# Patient Record
Sex: Female | Born: 1955 | Race: White | Hispanic: No | Marital: Married | Smoking: Never smoker
Health system: Southern US, Community
[De-identification: ages and names within clinical notes are randomized; demographics above are authoritative.]

## PROBLEM LIST (undated history)

## (undated) DIAGNOSIS — G4730 Sleep apnea, unspecified: Secondary | ICD-10-CM

## (undated) DIAGNOSIS — R519 Headache, unspecified: Secondary | ICD-10-CM

## (undated) DIAGNOSIS — J45909 Unspecified asthma, uncomplicated: Secondary | ICD-10-CM

## (undated) DIAGNOSIS — E669 Obesity, unspecified: Secondary | ICD-10-CM

## (undated) DIAGNOSIS — D649 Anemia, unspecified: Secondary | ICD-10-CM

## (undated) DIAGNOSIS — E119 Type 2 diabetes mellitus without complications: Secondary | ICD-10-CM

## (undated) DIAGNOSIS — I10 Essential (primary) hypertension: Secondary | ICD-10-CM

## (undated) DIAGNOSIS — E785 Hyperlipidemia, unspecified: Secondary | ICD-10-CM

## (undated) DIAGNOSIS — H3500 Unspecified background retinopathy: Secondary | ICD-10-CM

## (undated) DIAGNOSIS — M1990 Unspecified osteoarthritis, unspecified site: Secondary | ICD-10-CM

## (undated) HISTORY — PX: ESOPHAGOGASTRODUODENOSCOPY: SHX1529

## (undated) HISTORY — PX: CHOLECYSTECTOMY: SHX55

## (undated) HISTORY — PX: TONSILLECTOMY: SUR1361

## (undated) HISTORY — PX: COLONOSCOPY: SHX174

---

## 2005-06-16 ENCOUNTER — Ambulatory Visit: Payer: Self-pay | Admitting: Family Medicine

## 2006-05-12 ENCOUNTER — Other Ambulatory Visit: Payer: Self-pay

## 2006-05-12 ENCOUNTER — Inpatient Hospital Stay: Payer: Self-pay | Admitting: Internal Medicine

## 2006-07-21 ENCOUNTER — Ambulatory Visit: Payer: Self-pay | Admitting: Family Medicine

## 2006-08-31 ENCOUNTER — Ambulatory Visit: Payer: Self-pay | Admitting: Gastroenterology

## 2007-09-07 ENCOUNTER — Ambulatory Visit: Payer: Self-pay | Admitting: Family Medicine

## 2007-10-20 IMAGING — CT CT HEAD WITHOUT CONTRAST
2 series · 16 of 30 positions shown, 20 images · non-contrast
Comparison: none

REASON FOR EXAM: Numbness//rm7
COMMENTS:

[Series 2: without · axial · non-contrast · 0.39mm/px · z∈[+1028,+1148]mm · 13 of 29 slices shown, 17 images]
[im 3/29  brain]
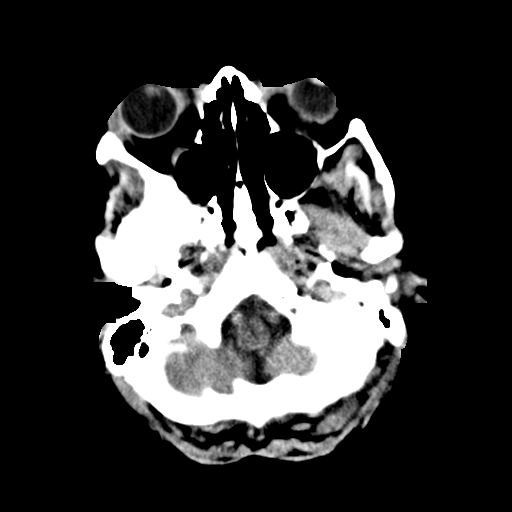
[im 3/29  bone]
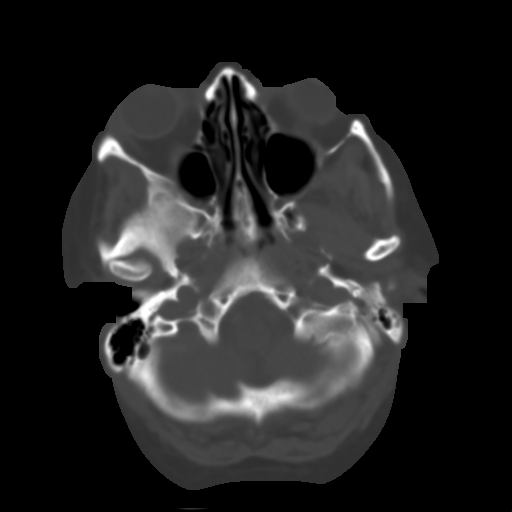
[im 5/29  brain]
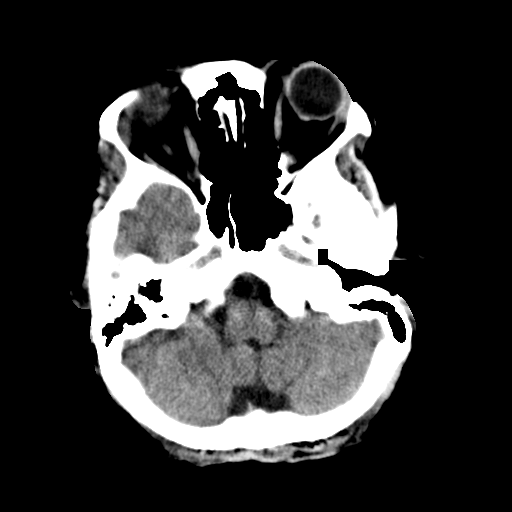
[im 7/29  brain]
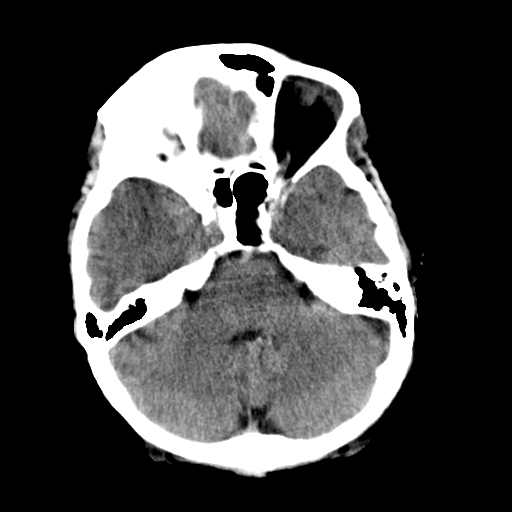
[im 9/29  brain]
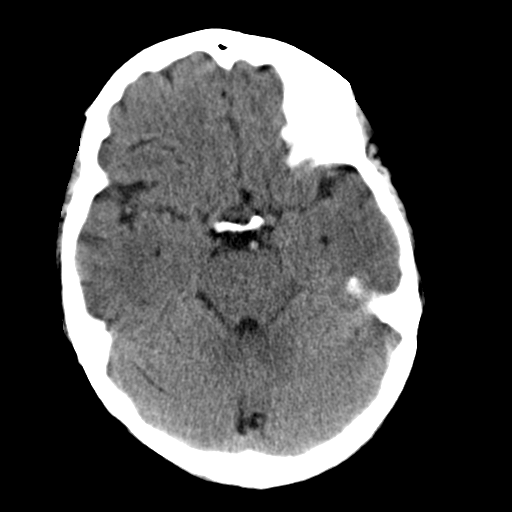
[im 11/29  brain]
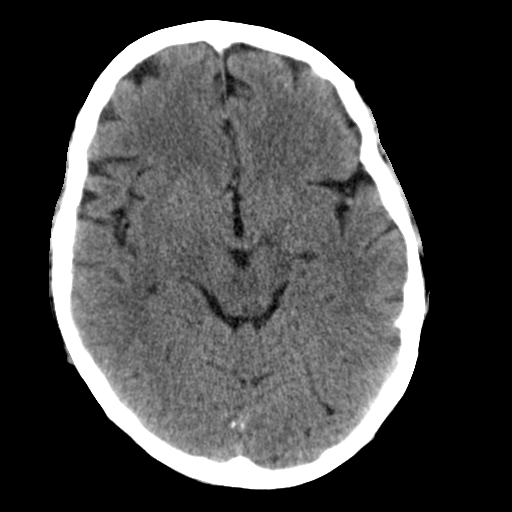
[im 11/29  bone]
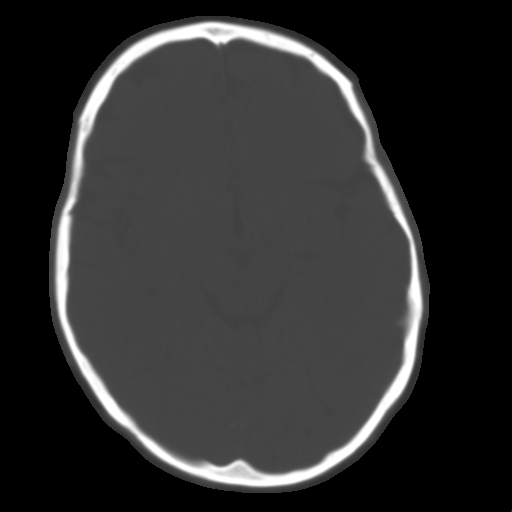
[im 13/29  brain]
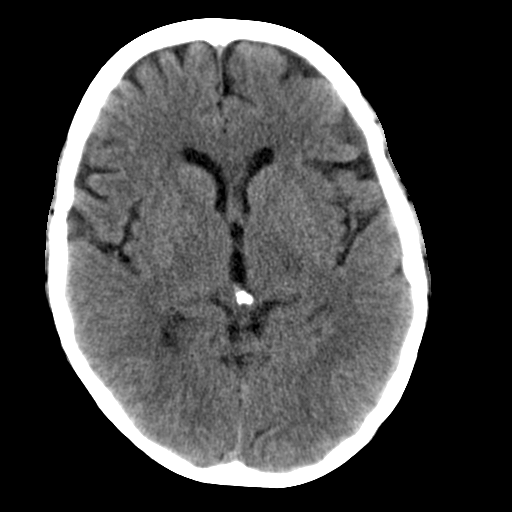
[im 15/29  brain]
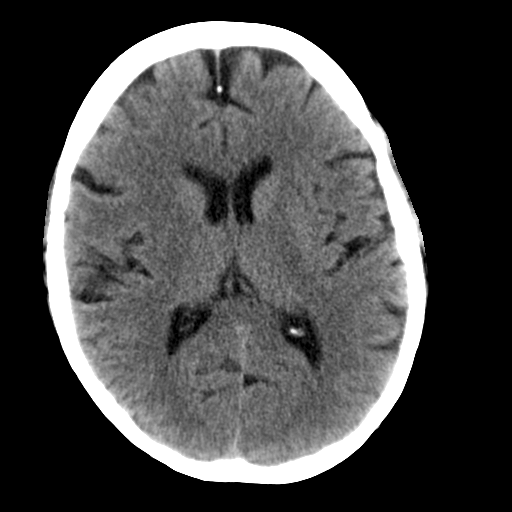
[im 17/29  brain]
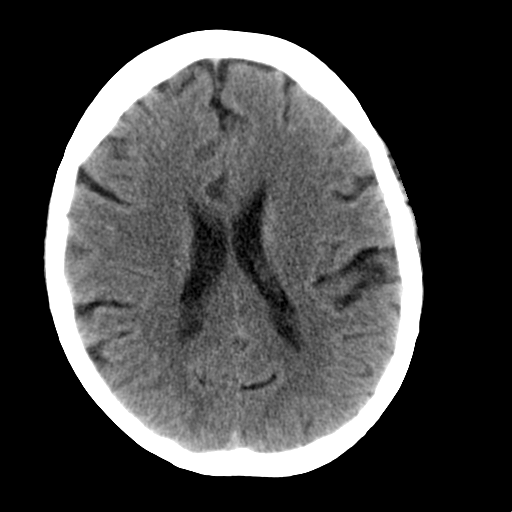
[im 19/29  brain]
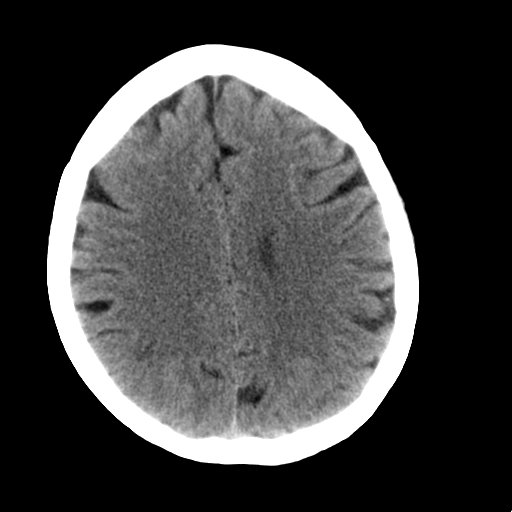
[im 19/29  bone]
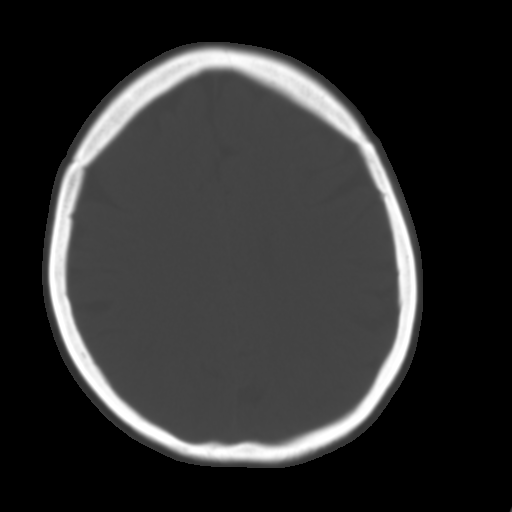
[im 21/29  brain]
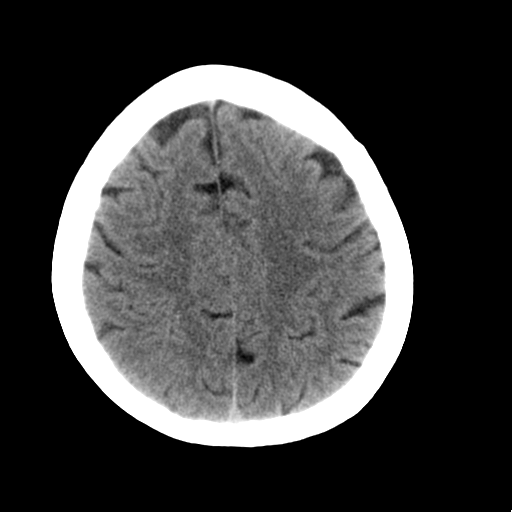
[im 23/29  brain]
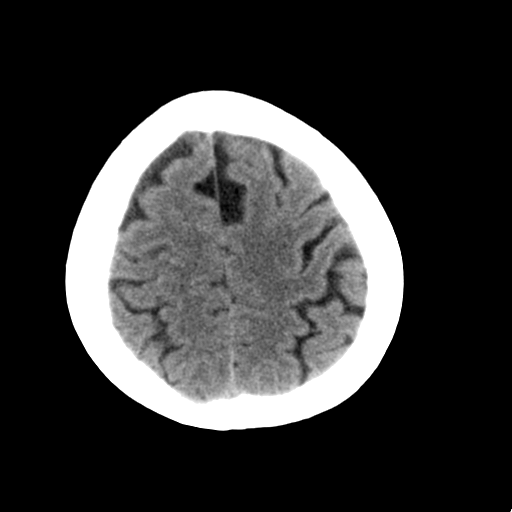
[im 25/29  brain]
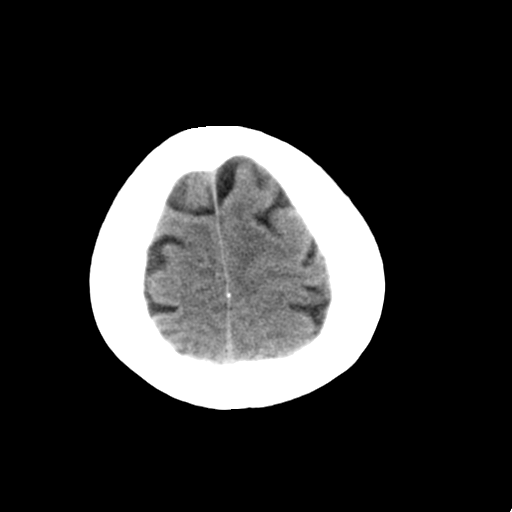
[im 27/29  brain]
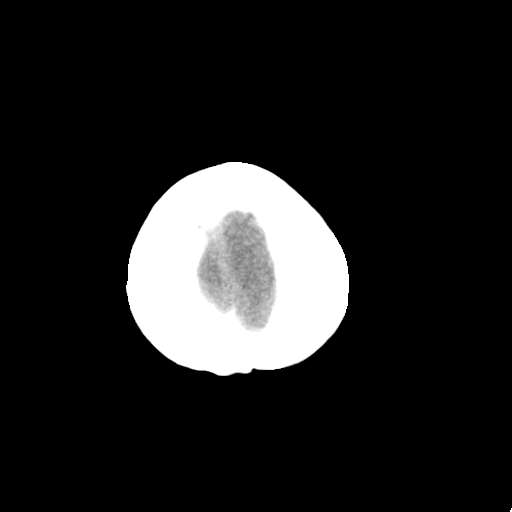
[im 27/29  bone]
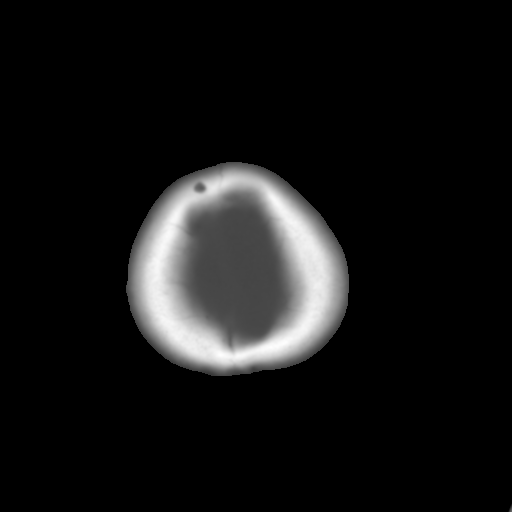

[Series 3: bone · axial · 0.39mm/px · z∈[+1028,+1068]mm · 3 of 29 slices shown]
[im 3/29  bone]
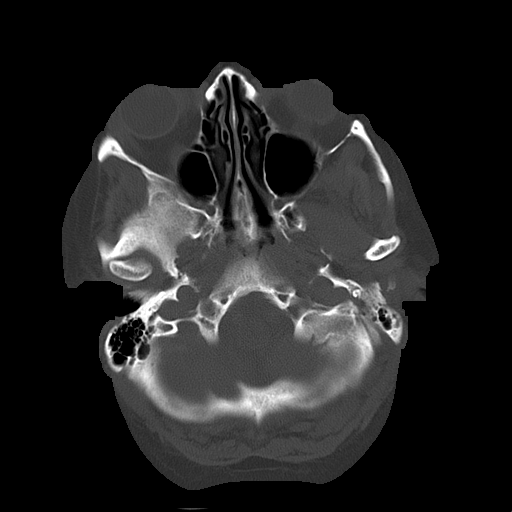
[im 7/29  bone]
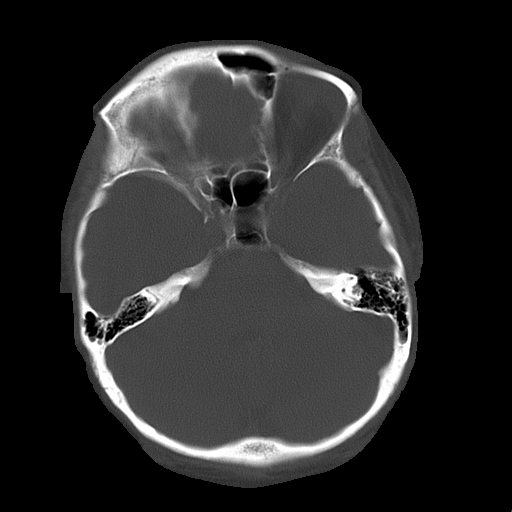
[im 11/29  bone]
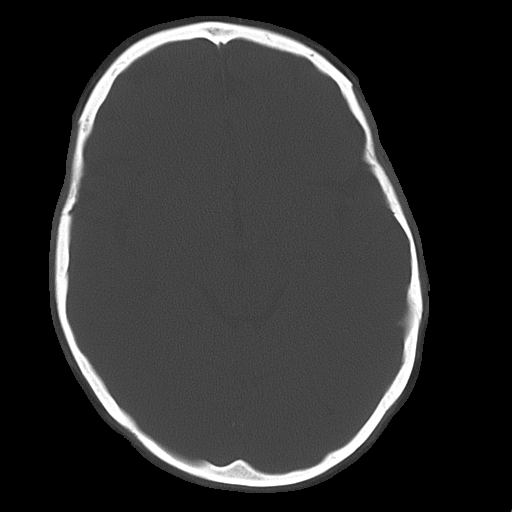

[16 of 30 positions shown; findings below may reference images not displayed]

PROCEDURE:     CT  - CT HEAD WITHOUT CONTRAST  - May 12, 2006  [DATE]

RESULT:     The patient is complaining of numbness.

The ventricles are normal in size and position. There is no intracranial
hemorrhage, mass, or mass-effect.

At bone window settings, I see no air-fluid levels in the visualized
portions of the paranasal sinuses. I see no lytic or blastic lesion.
IMPRESSION: I see no acute intracranial abnormality.

A preliminary report was sent to the emergent department at the conclusion
of the study.

## 2008-06-12 ENCOUNTER — Ambulatory Visit: Payer: Self-pay | Admitting: Unknown Physician Specialty

## 2008-06-27 ENCOUNTER — Ambulatory Visit: Payer: Self-pay

## 2008-07-17 ENCOUNTER — Ambulatory Visit: Payer: Self-pay | Admitting: Unknown Physician Specialty

## 2008-10-25 ENCOUNTER — Ambulatory Visit: Payer: Self-pay | Admitting: Family Medicine

## 2009-10-29 ENCOUNTER — Ambulatory Visit: Payer: Self-pay | Admitting: Unknown Physician Specialty

## 2010-02-28 ENCOUNTER — Ambulatory Visit: Payer: Self-pay | Admitting: Gastroenterology

## 2010-03-04 LAB — PATHOLOGY REPORT

## 2011-01-23 ENCOUNTER — Ambulatory Visit: Payer: Self-pay | Admitting: Unknown Physician Specialty

## 2011-02-12 ENCOUNTER — Ambulatory Visit: Payer: Self-pay | Admitting: Orthopedic Surgery

## 2011-02-19 ENCOUNTER — Ambulatory Visit: Payer: Self-pay | Admitting: Orthopedic Surgery

## 2012-07-04 ENCOUNTER — Ambulatory Visit: Payer: Self-pay | Admitting: Family Medicine

## 2012-07-08 ENCOUNTER — Ambulatory Visit: Payer: Self-pay | Admitting: Family Medicine

## 2012-07-19 ENCOUNTER — Ambulatory Visit: Payer: Self-pay | Admitting: Neurology

## 2012-12-16 ENCOUNTER — Ambulatory Visit: Payer: Self-pay | Admitting: Neurology

## 2013-08-23 ENCOUNTER — Ambulatory Visit: Payer: Self-pay | Admitting: Family Medicine

## 2014-08-28 ENCOUNTER — Ambulatory Visit: Payer: Self-pay | Admitting: Family Medicine

## 2014-10-01 ENCOUNTER — Ambulatory Visit: Payer: Self-pay | Admitting: Anesthesiology

## 2014-10-01 DIAGNOSIS — I10 Essential (primary) hypertension: Secondary | ICD-10-CM

## 2014-10-09 ENCOUNTER — Ambulatory Visit: Payer: Self-pay | Admitting: Surgery

## 2014-11-05 LAB — SURGICAL PATHOLOGY

## 2014-11-11 NOTE — Op Note (Signed)
PATIENT NAME:  Shelly Huang, Shelly Huang MR#:  [id number] DATE OF BIRTH:  1956/07/10  DATE OF PROCEDURE:  10/09/2014  PREOPERATIVE DIAGNOSIS: A lipoma of the left upper back.   POSTOPERATIVE DIAGNOSIS:  A lipoma of the left upper back.  PROCEDURE: Excision lipoma left upper back.   SURGEON: Rochel Brome, M.D.   ANESTHESIA: General, local.   INDICATIONS: This 59 year old female has a gradually enlarging mass of the left upper back and due to the fact that it continues to grow in size excision was recommended for further evaluation and treatment.   DESCRIPTION OF PROCEDURE: The patient was placed on the operating table in the prone position and was given intravenous medicines for sedation and was monitored by the anesthesia staff. The site was prepared with ChloraPrep and draped in a sterile manner. The mass was some 8 cm in dimension.  The skin was infiltrated with 1% Xylocaine with epinephrine. A transversely oriented 8 cm incision was made and did remove an ellipse of skin which was 1 cm in width carried down through subcutaneous tissues and used electrocautery for hemostasis.  Dissected down through superficial fascia and encountered a lobulated fatty mass.  The multiple loculations were dissected with blunt and sharp dissection. The mass did extend down as far as muscle and was further dissected and was removed. The ex vivo measurement was some 8 cm in dimension and was submitted for routine pathology. The wound was inspected. It is noted that one clamped bleeding point was suture ligated with 3-0 chromic. Several additional small bleeding points were cauterized. The deep fascia was approximated with interrupted 3-0 chromic. The superficial fascia was also closed with interrupted 3-0 chromic and the skin was closed with a running 4-0 Monocryl subcuticular suture and Dermabond. The patient appeared to tolerate the procedure satisfactorily and was subsequently prepared for transfer to the recovery  room.   ____________________________ Lenna Sciara. Rochel Brome, MD jws:sp D: 10/09/2014 11:21:35 ET T: 10/09/2014 12:22:50 ET JOB#: [id number]  cc: Loreli Dollar, MD, <Dictator> Loreli Dollar MD ELECTRONICALLY SIGNED 10/09/2014 17:23

## 2016-03-26 ENCOUNTER — Other Ambulatory Visit: Payer: Self-pay | Admitting: Family Medicine

## 2016-03-26 DIAGNOSIS — Z1231 Encounter for screening mammogram for malignant neoplasm of breast: Secondary | ICD-10-CM

## 2016-04-06 ENCOUNTER — Ambulatory Visit
Admission: RE | Admit: 2016-04-06 | Discharge: 2016-04-06 | Disposition: A | Discharge status: Home / Self Care | Payer: BLUE CROSS/BLUE SHIELD | Source: Ambulatory Visit | Attending: Family Medicine | Admitting: Family Medicine

## 2016-04-06 ENCOUNTER — Encounter: Payer: Self-pay | Admitting: Radiology

## 2016-04-06 DIAGNOSIS — Z1231 Encounter for screening mammogram for malignant neoplasm of breast: Secondary | ICD-10-CM | POA: Diagnosis present

## 2017-09-14 IMAGING — MG MM DIGITAL SCREENING BILAT W/ CAD
7 series · 7 of 7 positions shown · non-contrast
Comparison: Previous exam(s).

ACR Breast Density Category a: The breast tissue is almost entirely
fatty.

CLINICAL DATA: Screening.

EXAM:
DIGITAL SCREENING BILATERAL MAMMOGRAM WITH CAD

[R MLO (1 of 2)]
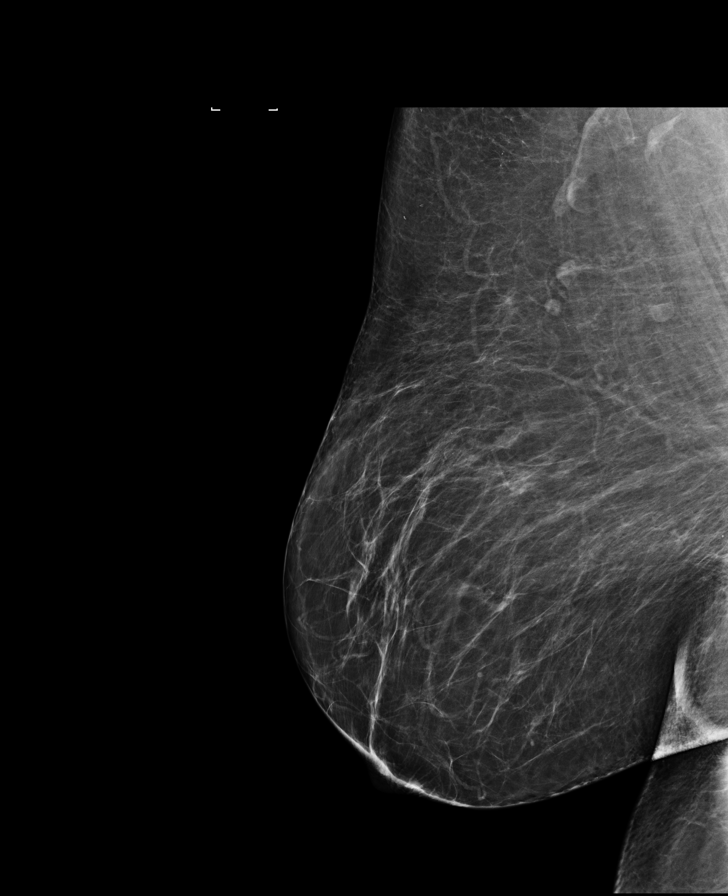

[R CV]
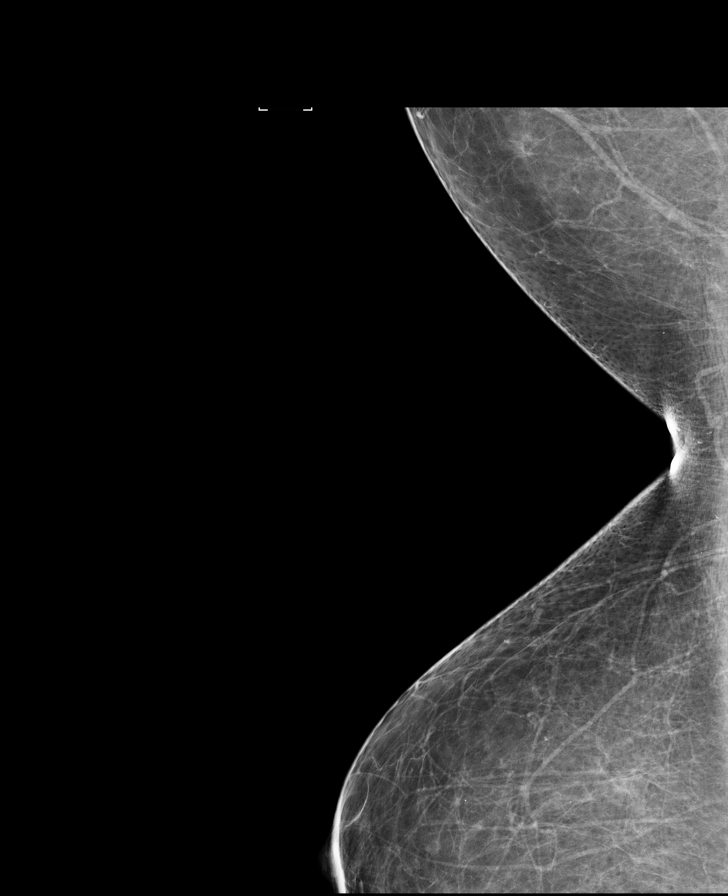

[L CC]
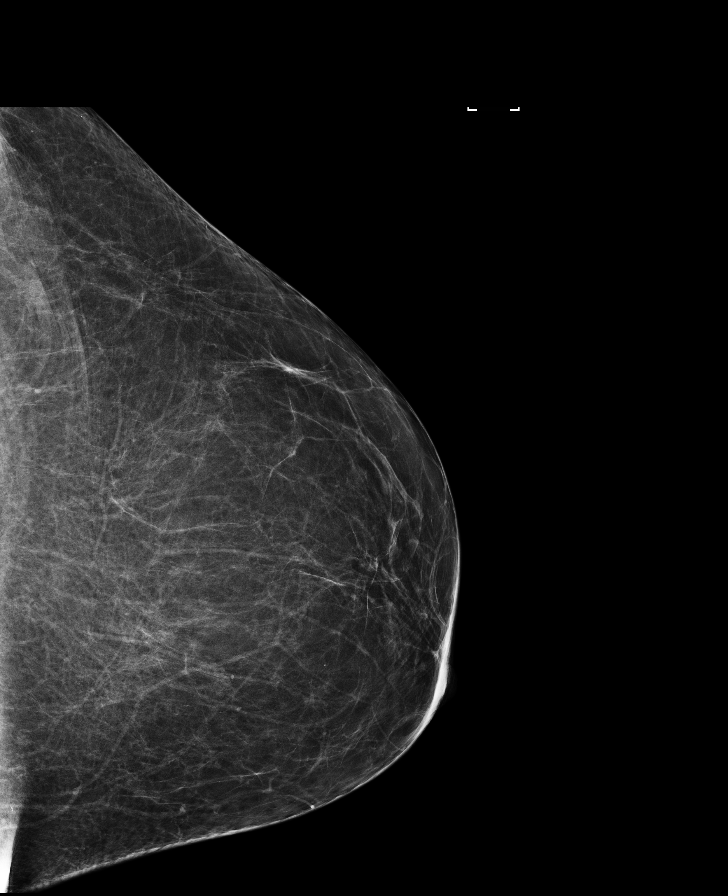

[L MLO (1 of 2)]
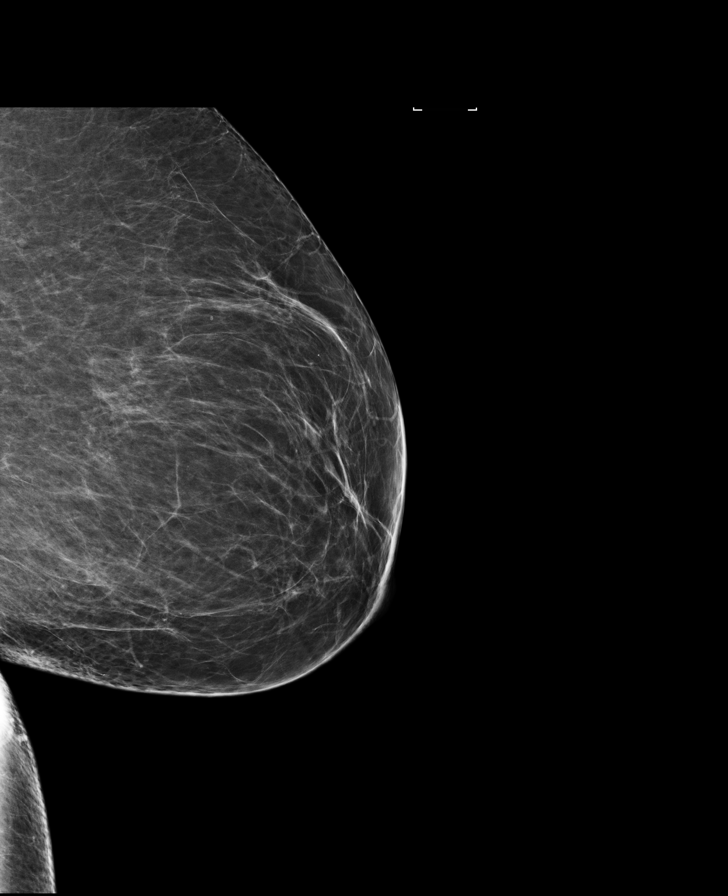

[R CC]
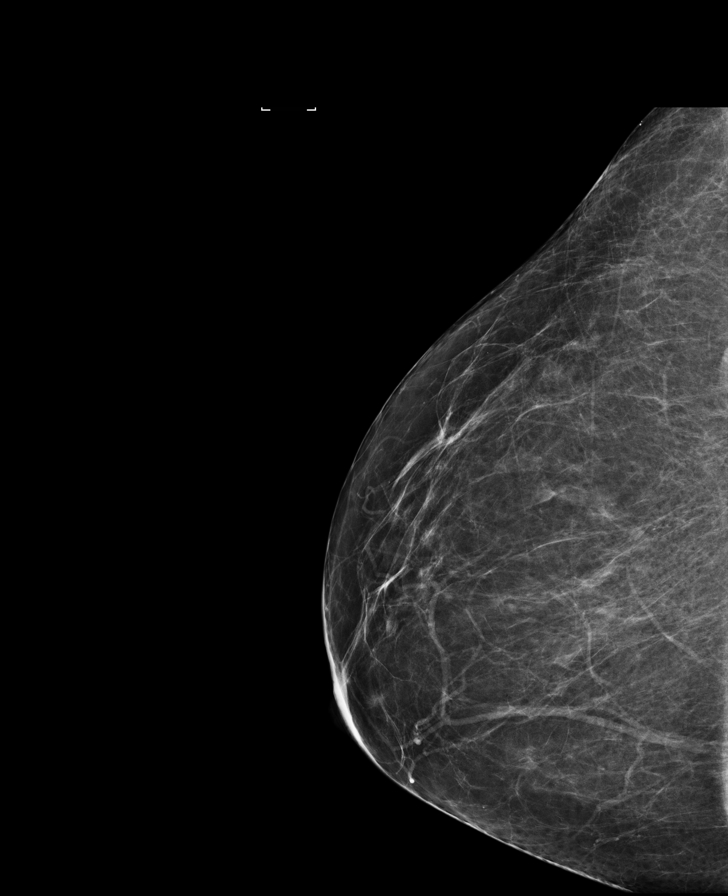

[R MLO (2 of 2)]
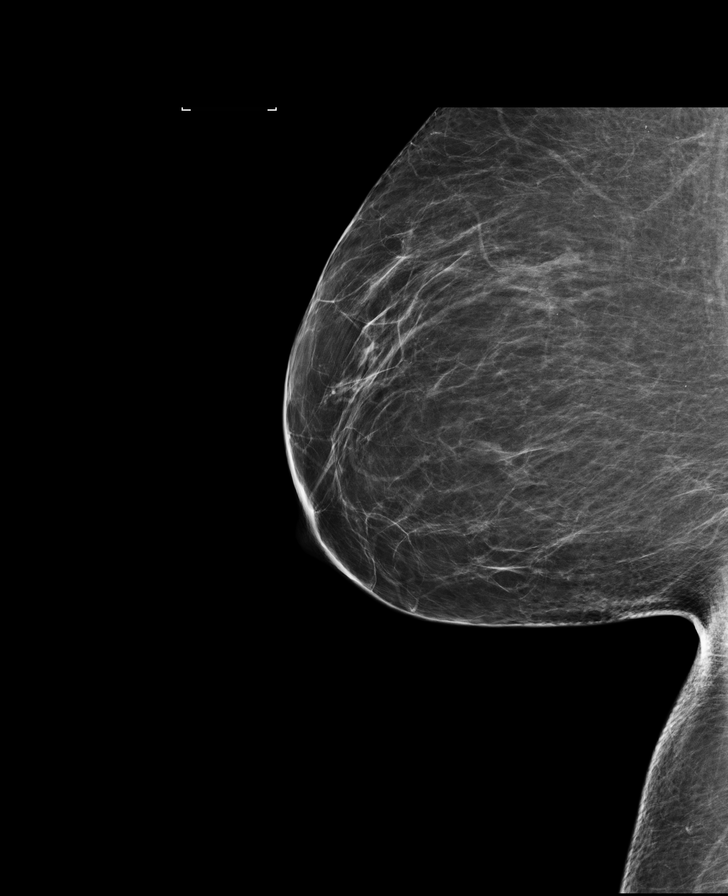

[L MLO (2 of 2)]
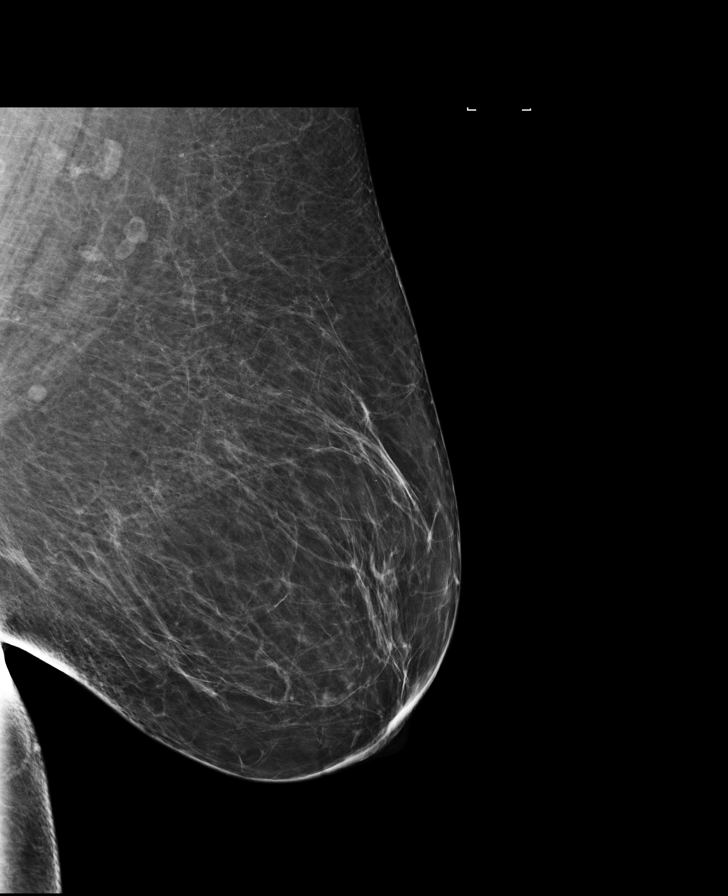

[7 of 7 positions shown; findings below may reference images not displayed]

FINDINGS: There are no findings suspicious for malignancy. Images were
processed with CAD.
IMPRESSION: No mammographic evidence of malignancy. A result letter of this
screening mammogram will be mailed directly to the patient.

RECOMMENDATION:
Screening mammogram in one year. (Code:[id number]-W-8NO)

BI-RADS CATEGORY  1: Negative.

## 2020-09-26 ENCOUNTER — Other Ambulatory Visit: Payer: Self-pay | Admitting: Family Medicine

## 2020-09-26 DIAGNOSIS — Z1231 Encounter for screening mammogram for malignant neoplasm of breast: Secondary | ICD-10-CM

## 2020-10-23 ENCOUNTER — Ambulatory Visit
Admission: RE | Admit: 2020-10-23 | Discharge: 2020-10-23 | Disposition: A | Discharge status: Home / Self Care | Payer: Medicare HMO | Source: Ambulatory Visit | Attending: Family Medicine | Admitting: Family Medicine

## 2020-10-23 ENCOUNTER — Other Ambulatory Visit: Payer: Self-pay

## 2020-10-23 DIAGNOSIS — Z1231 Encounter for screening mammogram for malignant neoplasm of breast: Secondary | ICD-10-CM | POA: Diagnosis present

## 2020-10-28 ENCOUNTER — Other Ambulatory Visit: Payer: Self-pay | Admitting: Family Medicine

## 2020-10-28 DIAGNOSIS — R928 Other abnormal and inconclusive findings on diagnostic imaging of breast: Secondary | ICD-10-CM

## 2020-10-28 DIAGNOSIS — N6489 Other specified disorders of breast: Secondary | ICD-10-CM

## 2020-11-11 ENCOUNTER — Ambulatory Visit
Admission: RE | Admit: 2020-11-11 | Discharge: 2020-11-11 | Disposition: A | Discharge status: Home / Self Care | Payer: Medicare HMO | Source: Ambulatory Visit | Attending: Family Medicine | Admitting: Family Medicine

## 2020-11-11 ENCOUNTER — Other Ambulatory Visit: Payer: Self-pay

## 2020-11-11 DIAGNOSIS — R928 Other abnormal and inconclusive findings on diagnostic imaging of breast: Secondary | ICD-10-CM | POA: Insufficient documentation

## 2020-11-11 DIAGNOSIS — N6489 Other specified disorders of breast: Secondary | ICD-10-CM

## 2021-01-28 ENCOUNTER — Encounter: Payer: Self-pay | Admitting: Internal Medicine

## 2021-01-29 ENCOUNTER — Encounter: Payer: Self-pay | Admitting: Internal Medicine

## 2021-01-29 ENCOUNTER — Ambulatory Visit
Admission: RE | Admit: 2021-01-29 | Discharge: 2021-01-29 | Disposition: A | Discharge status: Home / Self Care | Payer: Medicare HMO | Attending: Internal Medicine | Admitting: Internal Medicine

## 2021-01-29 ENCOUNTER — Ambulatory Visit: Payer: Medicare HMO | Admitting: Certified Registered"

## 2021-01-29 ENCOUNTER — Encounter
Admission: RE | Disposition: A | Discharge status: Home / Self Care | Payer: Self-pay | Source: Home / Self Care | Attending: Internal Medicine

## 2021-01-29 DIAGNOSIS — Z8371 Family history of colonic polyps: Secondary | ICD-10-CM | POA: Insufficient documentation

## 2021-01-29 DIAGNOSIS — Z794 Long term (current) use of insulin: Secondary | ICD-10-CM | POA: Insufficient documentation

## 2021-01-29 DIAGNOSIS — Z7984 Long term (current) use of oral hypoglycemic drugs: Secondary | ICD-10-CM | POA: Insufficient documentation

## 2021-01-29 DIAGNOSIS — Z79899 Other long term (current) drug therapy: Secondary | ICD-10-CM | POA: Insufficient documentation

## 2021-01-29 DIAGNOSIS — Z1211 Encounter for screening for malignant neoplasm of colon: Secondary | ICD-10-CM | POA: Diagnosis not present

## 2021-01-29 DIAGNOSIS — D123 Benign neoplasm of transverse colon: Secondary | ICD-10-CM | POA: Insufficient documentation

## 2021-01-29 DIAGNOSIS — K640 First degree hemorrhoids: Secondary | ICD-10-CM | POA: Diagnosis not present

## 2021-01-29 DIAGNOSIS — K5730 Diverticulosis of large intestine without perforation or abscess without bleeding: Secondary | ICD-10-CM | POA: Insufficient documentation

## 2021-01-29 DIAGNOSIS — Z7982 Long term (current) use of aspirin: Secondary | ICD-10-CM | POA: Insufficient documentation

## 2021-01-29 DIAGNOSIS — Z881 Allergy status to other antibiotic agents status: Secondary | ICD-10-CM | POA: Diagnosis not present

## 2021-01-29 DIAGNOSIS — D124 Benign neoplasm of descending colon: Secondary | ICD-10-CM | POA: Diagnosis not present

## 2021-01-29 HISTORY — DX: Headache, unspecified: R51.9

## 2021-01-29 HISTORY — DX: Hyperlipidemia, unspecified: E78.5

## 2021-01-29 HISTORY — DX: Unspecified asthma, uncomplicated: J45.909

## 2021-01-29 HISTORY — PX: COLONOSCOPY WITH PROPOFOL: SHX5780

## 2021-01-29 HISTORY — DX: Sleep apnea, unspecified: G47.30

## 2021-01-29 HISTORY — DX: Unspecified background retinopathy: H35.00

## 2021-01-29 HISTORY — DX: Obesity, unspecified: E66.9

## 2021-01-29 HISTORY — DX: Type 2 diabetes mellitus without complications: E11.9

## 2021-01-29 HISTORY — DX: Unspecified osteoarthritis, unspecified site: M19.90

## 2021-01-29 HISTORY — DX: Anemia, unspecified: D64.9

## 2021-01-29 HISTORY — DX: Essential (primary) hypertension: I10

## 2021-01-29 LAB — GLUCOSE, CAPILLARY: Glucose-Capillary: 139 mg/dL — ABNORMAL HIGH (ref 70–99)

## 2021-01-29 SURGERY — COLONOSCOPY WITH PROPOFOL
Anesthesia: General

## 2021-01-29 MED ORDER — DEXMEDETOMIDINE (PRECEDEX) IN NS 20 MCG/5ML (4 MCG/ML) IV SYRINGE
PREFILLED_SYRINGE | INTRAVENOUS | Status: DC | PRN
  Administered 2021-01-29: 8 ug via INTRAVENOUS

## 2021-01-29 MED ORDER — SODIUM CHLORIDE 0.9 % IV SOLN: INTRAVENOUS | Status: DC

## 2021-01-29 MED ORDER — PROPOFOL 500 MG/50ML IV EMUL
INTRAVENOUS | Status: DC | PRN
  Administered 2021-01-29: 150 ug/kg/min via INTRAVENOUS

## 2021-01-29 MED ORDER — PROPOFOL 10 MG/ML IV BOLUS
INTRAVENOUS | Status: DC | PRN
  Administered 2021-01-29: 30 mg via INTRAVENOUS
  Administered 2021-01-29: 70 mg via INTRAVENOUS

## 2021-01-29 MED ORDER — LIDOCAINE HCL (CARDIAC) PF 100 MG/5ML IV SOSY
PREFILLED_SYRINGE | INTRAVENOUS | Status: DC | PRN
  Administered 2021-01-29: 50 mg via INTRAVENOUS

## 2021-01-29 NOTE — Op Note (Signed)
[facility] Gastroenterology Patient Name: Shelly Huang Procedure Date: 01/29/2021 10:28 AM MRN: [id number] Account #: [id number] Date of Birth: [birth year] Admit Type: Outpatient Age: 65 Room: [facility] ENDO ROOM 2 Gender: Female Note Status: Finalized Procedure:             Colonoscopy Indications:           Colon cancer screening in patient at increased risk:                         Family history of 1st-degree relative with colon                         polyps before age 67 years Providers:             Benay Pike. [name] MD, MD Referring MD:          Irven Easterly. Kary Kos, MD (Referring MD) Medicines:             Propofol per Anesthesia Complications:         No immediate complications. Procedure:             Pre-Anesthesia Assessment:                        - The risks and benefits of the procedure and the                         sedation options and risks were discussed with the                         patient. All questions were answered and informed                         consent was obtained.                        - Patient identification and proposed procedure were                         verified prior to the procedure by the nurse. The                         procedure was verified in the procedure room.                        - ASA Grade Assessment: III - A patient with severe                         systemic disease.                        - After reviewing the risks and benefits, the patient                         was deemed in satisfactory condition to undergo the                         procedure.                        After obtaining informed  consent, the colonoscope was                         passed under direct vision. Throughout the procedure,                         the patient's blood pressure, pulse, and oxygen                         saturations were monitored continuously. The                         Colonoscope was introduced through the  anus and                         advanced to the the cecum, identified by appendiceal                         orifice and ileocecal valve. The colonoscopy was                         performed without difficulty. The patient tolerated                         the procedure well. The quality of the bowel                         preparation was good. The ileocecal valve, appendiceal                         orifice, and rectum were photographed. Findings:      The perianal and digital rectal examinations were normal. Pertinent       negatives include normal sphincter tone and no palpable rectal lesions.      Non-bleeding internal hemorrhoids were found during retroflexion. The       hemorrhoids were Grade I (internal hemorrhoids that do not prolapse).      Many small and large-mouthed diverticula were found in the sigmoid colon.      A 5 mm polyp was found in the hepatic flexure. The polyp was sessile.       The polyp was removed with a jumbo cold forceps. Resection and retrieval       were complete.      Two sessile polyps were found in the transverse colon. The polyps were 4       to 5 mm in size. These polyps were removed with a jumbo cold forceps.       Resection and retrieval were complete.      A 8 mm polyp was found in the transverse colon. The polyp was flat. The       polyp was removed with a hot snare. Resection and retrieval were       complete.      A 12 mm polyp was found in the distal transverse colon. The polyp was       semi-pedunculated. The polyp was removed with a hot snare. Resection and       retrieval were complete.      The exam was otherwise without abnormality. Impression:            - Non-bleeding internal hemorrhoids.                        -  Diverticulosis in the sigmoid colon.                        - One 5 mm polyp at the hepatic flexure, removed with                         a jumbo cold forceps. Resected and retrieved.                        - Two 4 to 5 mm  polyps in the transverse colon,                         removed with a jumbo cold forceps. Resected and                         retrieved.                        - One 8 mm polyp in the transverse colon, removed with                         a hot snare. Resected and retrieved.                        - One 12 mm polyp in the distal transverse colon,                         removed with a hot snare. Resected and retrieved.                        - The examination was otherwise normal. Recommendation:        - Patient has a contact number available for                         emergencies. The signs and symptoms of potential                         delayed complications were discussed with the patient.                         Return to normal activities tomorrow. Written                         discharge instructions were provided to the patient.                        - Resume previous diet.                        - Continue present medications.                        - Repeat colonoscopy is recommended for surveillance.                         The colonoscopy date will be determined after                         pathology results  from today's exam become available                         for review.                        - Return to GI clinic PRN.                        - The findings and recommendations were discussed with                         the patient. Procedure Code(s):     --- Professional ---                        [phone], Colonoscopy, flexible; with removal of                         tumor(s), polyp(s), or other lesion(s) by snare                         technique                        45380, 30, Colonoscopy, flexible; with biopsy, single                         or multiple Diagnosis Code(s):     --- Professional ---                        K57.30, Diverticulosis of large intestine without                         perforation or abscess without bleeding                         K64.0, First degree hemorrhoids                        Z83.71, Family history of colonic polyps                        K63.5, Polyp of colon CPT copyright 2019 [employer]. All rights reserved. The codes documented in this report are preliminary and upon coder review may  be revised to meet current compliance requirements. Efrain Sella MD, MD 01/29/2021 11:09:59 AM This report has been signed electronically. Number of Addenda: 0 Note Initiated On: 01/29/2021 10:28 AM Scope Withdrawal Time: 0 hours 15 minutes 40 seconds  Total Procedure Duration: 0 hours 20 minutes 6 seconds  Estimated Blood Loss:  Estimated blood loss: none.      [facility]

## 2021-01-29 NOTE — H&P (Signed)
Outpatient short stay form Pre-procedure 01/29/2021 9:25 AM Shelly Huang K. Shelly Huang, M.D.  Primary Physician: Shelly Huang, M.D.  Reason for visit:  Family history of colon polyps (son) age < 62  History of present illness:   65 year old patient presenting for family history of colon polyps. Patient denies any change in bowel habits, rectal bleeding or involuntary weight loss.    No current facility-administered medications for this encounter.  Medications Prior to Admission  Medication Sig Dispense Refill Last Dose   amLODipine (NORVASC) 10 MG tablet Take 10 mg by mouth daily.      aspirin EC 81 MG tablet Take 81 mg by mouth daily. Swallow whole.      ferrous sulfate 325 (65 FE) MG tablet Take 325 mg by mouth daily with breakfast.      fluticasone (FLONASE) 50 MCG/ACT nasal spray Place 2 sprays into both nostrils daily.      glucose blood test strip 1 each by Other route as needed for other. Use as instructed      hyoscyamine (LEVSIN) 0.125 MG tablet Take 0.125 mg by mouth every 4 (four) hours as needed.      imipramine (TOFRANIL) 10 MG tablet Take 10 mg by mouth at bedtime.      insulin detemir (LEVEMIR) 100 unit/ml SOLN Inject 40 Units into the skin daily.      metFORMIN (GLUCOPHAGE) 1000 MG tablet Take 1,000 mg by mouth 2 (two) times daily with a meal. 1/2 TABLET AT LUNCH      omeprazole (PRILOSEC) 40 MG capsule Take 40 mg by mouth daily.      pioglitazone (ACTOS) 45 MG tablet Take 45 mg by mouth daily.      pravastatin (PRAVACHOL) 20 MG tablet Take 20 mg by mouth daily.      ramipril (ALTACE) 10 MG capsule Take 10 mg by mouth daily.      triamterene-hydrochlorothiazide (MAXZIDE-25) 37.5-25 MG tablet Take 1 tablet by mouth daily.        Allergies  Allergen Reactions   Erythromycin Itching and Nausea And Vomiting     Past Medical History:  Diagnosis Date   Anemia    Arthritis    Asthma    Diabetes mellitus without complication ([place])    Headache    Hyperlipidemia     Hypertension    Obesity    Retinopathy    Sleep apnea     Review of systems:  Otherwise negative.    Physical Exam  Gen: Alert, oriented. Appears stated age.  HEENT: [place]/AT. PERRLA. Lungs: CTA, no wheezes. CV: RR nl S1, S2. Abd: soft, benign, no masses. BS+ Ext: No edema. Pulses 2+    Planned procedures: Proceed with colonoscopy. The patient understands the nature of the planned procedure, indications, risks, alternatives and potential complications including but not limited to bleeding, infection, perforation, damage to internal organs and possible oversedation/side effects from anesthesia. The patient agrees and gives consent to proceed.  Please refer to procedure notes for findings, recommendations and patient disposition/instructions.     Shelly Huang K. Shelly Huang, M.D. Gastroenterology 01/29/2021  9:25 AM

## 2021-01-29 NOTE — Transfer of Care (Signed)
Immediate Anesthesia Transfer of Care Note  Patient: SWEDEN LESURE  Procedure(s) Performed: COLONOSCOPY WITH PROPOFOL  Patient Location: PACU  Anesthesia Type:General  Level of Consciousness: drowsy  Airway & Oxygen Therapy: Patient Spontanous Breathing  Post-op Assessment: Report given to RN and Post -op Vital signs reviewed and stable  Post vital signs: Reviewed and stable  Last Vitals:  Vitals Value Taken Time  BP 116/52 01/29/21 1107  Temp 36 C 01/29/21 1108  Pulse 95 01/29/21 1108  Resp 16 01/29/21 1108  SpO2 98 % 01/29/21 1108  Vitals shown include unvalidated device data.  Last Pain:  Vitals:   01/29/21 1107  TempSrc: Tympanic  PainSc: Asleep         Complications: No notable events documented.

## 2021-01-29 NOTE — Anesthesia Postprocedure Evaluation (Signed)
Anesthesia Post Note  Patient: Shelly Huang  Procedure(s) Performed: COLONOSCOPY WITH PROPOFOL  Patient location during evaluation: Endoscopy Anesthesia Type: General Level of consciousness: awake and alert Pain management: pain level controlled Vital Signs Assessment: post-procedure vital signs reviewed and stable Respiratory status: spontaneous breathing, nonlabored ventilation, respiratory function stable and patient connected to nasal cannula oxygen Cardiovascular status: blood pressure returned to baseline and stable Postop Assessment: no apparent nausea or vomiting Anesthetic complications: no   No notable events documented.   Last Vitals:  Vitals:   01/29/21 1127 01/29/21 1137  BP: (!) 143/63 130/64  Pulse: 93 97  Resp: (!) 21 18  Temp:    SpO2: 100% 90%    Last Pain:  Vitals:   01/29/21 1137  TempSrc:   PainSc: 0-No pain                 Precious Haws Carylon Tamburro

## 2021-01-29 NOTE — Interval H&P Note (Signed)
History and Physical Interval Note:  01/29/2021 9:26 AM  Shelly Huang  has presented today for surgery, with the diagnosis of FH POLYPS SON.  The various methods of treatment have been discussed with the patient and family. After consideration of risks, benefits and other options for treatment, the patient has consented to  Procedure(s) with comments: COLONOSCOPY WITH PROPOFOL (N/A) - DM as a surgical intervention.  The patient's history has been reviewed, patient examined, no change in status, stable for surgery.  I have reviewed the patient's chart and labs.  Questions were answered to the patient's satisfaction.     [name], [place]

## 2021-01-29 NOTE — Anesthesia Preprocedure Evaluation (Signed)
Anesthesia Evaluation  Patient identified by MRN, date of birth, ID band Patient awake    Reviewed: Allergy & Precautions, NPO status , Patient's Chart, lab work & pertinent test results  History of Anesthesia Complications Negative for: history of anesthetic complications  Airway Mallampati: III  TM Distance: <3 FB Neck ROM: full    Dental  (+) Chipped   Pulmonary asthma , sleep apnea ,    Pulmonary exam normal        Cardiovascular Exercise Tolerance: Good hypertension, Normal cardiovascular exam     Neuro/Psych  Headaches, negative psych ROS   GI/Hepatic negative GI ROS, Neg liver ROS,   Endo/Other  diabetes, Type 2  Renal/GU negative Renal ROS  negative genitourinary   Musculoskeletal  (+) Arthritis ,   Abdominal   Peds  Hematology negative hematology ROS (+)   Anesthesia Other Findings Past Medical History: No date: Anemia No date: Arthritis No date: Asthma No date: Diabetes mellitus without complication (HCC) No date: Headache No date: Hyperlipidemia No date: Hypertension No date: Obesity No date: Retinopathy No date: Sleep apnea  Past Surgical History: No date: CHOLECYSTECTOMY No date: COLONOSCOPY No date: ESOPHAGOGASTRODUODENOSCOPY No date: TONSILLECTOMY     Reproductive/Obstetrics negative OB ROS                             Anesthesia Physical Anesthesia Plan  ASA: 3  Anesthesia Plan: General   Post-op Pain Management:    Induction: Intravenous  PONV Risk Score and Plan: Propofol infusion and TIVA  Airway Management Planned: Natural Airway and Nasal Cannula  Additional Equipment:   Intra-op Plan:   Post-operative Plan:   Informed Consent: I have reviewed the patients History and Physical, chart, labs and discussed the procedure including the risks, benefits and alternatives for the proposed anesthesia with the patient or authorized representative  who has indicated his/her understanding and acceptance.     Dental Advisory Given  Plan Discussed with: Anesthesiologist, CRNA and Surgeon  Anesthesia Plan Comments: (Patient consented for risks of anesthesia including but not limited to:  - adverse reactions to medications - risk of airway placement if required - damage to eyes, teeth, lips or other oral mucosa - nerve damage due to positioning  - sore throat or hoarseness - Damage to heart, brain, nerves, lungs, other parts of body or loss of life  Patient voiced understanding.)        Anesthesia Quick Evaluation

## 2021-01-30 ENCOUNTER — Encounter: Payer: Self-pay | Admitting: Internal Medicine

## 2021-01-30 LAB — SURGICAL PATHOLOGY

## 2022-01-16 ENCOUNTER — Other Ambulatory Visit: Payer: Self-pay | Admitting: Family Medicine

## 2022-01-16 DIAGNOSIS — Z1231 Encounter for screening mammogram for malignant neoplasm of breast: Secondary | ICD-10-CM

## 2022-03-05 ENCOUNTER — Ambulatory Visit
Admission: RE | Admit: 2022-03-05 | Discharge: 2022-03-05 | Disposition: A | Discharge status: Home / Self Care | Payer: Medicare HMO | Source: Ambulatory Visit | Attending: Family Medicine | Admitting: Family Medicine

## 2022-03-05 DIAGNOSIS — Z1231 Encounter for screening mammogram for malignant neoplasm of breast: Secondary | ICD-10-CM | POA: Insufficient documentation

## 2023-04-12 ENCOUNTER — Other Ambulatory Visit: Payer: Self-pay | Admitting: Family Medicine

## 2023-04-12 DIAGNOSIS — Z1231 Encounter for screening mammogram for malignant neoplasm of breast: Secondary | ICD-10-CM

## 2023-04-15 ENCOUNTER — Ambulatory Visit
Admission: RE | Admit: 2023-04-15 | Discharge: 2023-04-15 | Disposition: A | Discharge status: Home / Self Care | Payer: Medicare HMO | Source: Ambulatory Visit | Attending: Family Medicine | Admitting: Family Medicine

## 2023-04-15 DIAGNOSIS — Z1231 Encounter for screening mammogram for malignant neoplasm of breast: Secondary | ICD-10-CM | POA: Insufficient documentation

## 2024-07-20 ENCOUNTER — Ambulatory Visit

## 2024-07-20 DIAGNOSIS — D649 Anemia, unspecified: Secondary | ICD-10-CM | POA: Insufficient documentation

## 2024-07-20 DIAGNOSIS — E785 Hyperlipidemia, unspecified: Secondary | ICD-10-CM | POA: Insufficient documentation

## 2024-07-20 DIAGNOSIS — E1121 Type 2 diabetes mellitus with diabetic nephropathy: Secondary | ICD-10-CM | POA: Insufficient documentation

## 2024-07-20 DIAGNOSIS — N951 Menopausal and female climacteric states: Secondary | ICD-10-CM | POA: Insufficient documentation

## 2024-07-20 DIAGNOSIS — R7982 Elevated C-reactive protein (CRP): Secondary | ICD-10-CM | POA: Insufficient documentation

## 2024-07-20 DIAGNOSIS — H3500 Unspecified background retinopathy: Secondary | ICD-10-CM | POA: Insufficient documentation

## 2024-07-20 DIAGNOSIS — E119 Type 2 diabetes mellitus without complications: Secondary | ICD-10-CM | POA: Insufficient documentation

## 2024-07-20 DIAGNOSIS — L732 Hidradenitis suppurativa: Secondary | ICD-10-CM

## 2024-07-20 DIAGNOSIS — G4733 Obstructive sleep apnea (adult) (pediatric): Secondary | ICD-10-CM | POA: Insufficient documentation

## 2024-07-20 DIAGNOSIS — E669 Obesity, unspecified: Secondary | ICD-10-CM | POA: Insufficient documentation

## 2024-07-20 DIAGNOSIS — I10 Essential (primary) hypertension: Secondary | ICD-10-CM | POA: Insufficient documentation

## 2024-07-20 MED ORDER — COSENTYX UNOREADY 300 MG/2ML ~~LOC~~ SOAJ: 300.0000 mg | SUBCUTANEOUS | 0 refills | Status: AC

## 2024-07-20 MED ORDER — DOXYCYCLINE MONOHYDRATE 100 MG PO TABS: 100.0000 mg | ORAL_TABLET | Freq: Two times a day (BID) | ORAL | 0 refills | Status: AC

## 2024-07-20 MED ORDER — CLINDAMYCIN PHOSPHATE 1 % EX SWAB: CUTANEOUS | 5 refills | Status: DC

## 2024-07-20 MED ORDER — COSENTYX UNOREADY 300 MG/2ML ~~LOC~~ SOAJ: 300.0000 mg | SUBCUTANEOUS | 8 refills | Status: AC

## 2024-07-20 NOTE — Patient Instructions (Addendum)
 Shelly Huang Your prescription has been sent to [facility].  [name] will call you to obtain additional  information needed to fill your prescription such as:  Additional insurance information Payment for any out-of-pocket expense (may require credit card) Address for drug delivery shipment  A timely response to the pharmacy may help you obtain your medication more quickly. Please call Shelly Huang at [phone].  Secukinumab  (Cosentyx )  Secukinumab  (Cosentyx ) is a biologic medication used to treat conditions like psoriasis, psoriatic arthritis, and ankylosing spondylitis. Biologics are medicines that humans made through genetic engineering techniques and closely related to a protein that occurs naturally in the body.  How To Take It Secukinumab  can be injected under the skin (subcutaneous injection) or injected into the vein through IV (intravenous infusion). When injecting under the skin, the site of injection should be rotated so the same site is not used multiple times. For injecting into the vein through IV, it is done by special nurses that will always monitor the entire process. Some patients will start to see improvement within a few weeks, but it may take several months to take full effect. Secukinumab  may be taken alone or other medication such as with methotrexate. Secukinumab  should not be given with another biologic drug.  Side Effects The most common side effects are infections such as cold symptoms or diarrhea. If you develop symptoms of an infection while using this medication, you should stop it and contact your doctor. Rare cases of inflammatory bowel disease, such as Crohns disease or ulcerative colitis, have been seen. Very rarely, patients have developed allergic reactions to secukinumab . Secukinumab  can lower your immune systems ability to fight infections. All patients should be tested for tuberculosis before starting on  secukinumab .  Tell Your Provider If you develop signs of an infection or have any side effects, especially diarrhea, bloody bowel movements, abdominal pain, fever, or allergic reactions, you should stop taking the medication and contact your provider. If you are pregnant or considering pregnancy, let your doctor know before starting this medication. Secukinumab  has not been studied in pregnancy or breastfeeding. Be sure to talk with your provider before receiving any vaccines or undergoing any surgeries while taking this medication.  Updated March 2024 by Kayla Cassis, MD, and reviewed by the [facility] of Rheumatology Communications and Marketing Committee.  This information is provided for general education only. Individuals should consult a qualified health care provider for professional medical advice, diagnosis, and treatment of a medical or health condition.     Due to recent changes in healthcare laws, you may see results of your pathology and/or laboratory studies on MyChart before the doctors have had a chance to review them. We understand that in some cases there may be results that are confusing or concerning to you. Please understand that not all results are received at the same time and often the doctors may need to interpret multiple results in order to provide you with the best plan of care or course of treatment. Therefore, we ask that you please give us  2 business days to thoroughly review all your results before contacting the office for clarification. Should we see a critical lab result, you will be contacted sooner.   If You Need Anything After Your Visit  If you have any questions or concerns for your doctor, please call our main line at [phone] and press option 4 to reach your doctor's medical assistant. If no one answers, please leave a voicemail as  directed and we will return your call as soon as possible. Messages left after 4 pm will be answered the following  business day.   You may also send us  a message via MyChart. We typically respond to MyChart messages within 1-2 business days.  For prescription refills, please ask your pharmacy to contact our office. Our fax number is [phone].  If you have an urgent issue when the clinic is closed that cannot wait until the next business day, you can page your doctor at the number below.    Please note that while we do our best to be available for urgent issues outside of office hours, we are not available 24/7.   If you have an urgent issue and are unable to reach us , you may choose to seek medical care at your doctor's office, retail clinic, urgent care center, or emergency room.  If you have a medical emergency, please immediately call 911 or go to the emergency department.  Pager Numbers  - Dr. Hester: [phone]  - Dr. Jackquline: [phone]  - Dr. Claudene: [phone]   - Dr. Raymund: [phone]  In the event of inclement weather, please call our main line at [phone] for an update on the status of any delays or closures.  Dermatology Medication Tips: Please keep the boxes that topical medications come in in order to help keep track of the instructions about where and how to use these. Pharmacies typically print the medication instructions only on the boxes and not directly on the medication tubes.   If your medication is too expensive, please contact our office at [phone] option 4 or send us  a message through MyChart.   We are unable to tell what your co-pay for medications will be in advance as this is different depending on your insurance coverage. However, we may be able to find a substitute medication at lower cost or fill out paperwork to get insurance to cover a needed medication.   If a prior authorization is required to get your medication covered by your insurance company, please allow us  1-2 business days to complete this process.  Drug prices often vary depending  on where the prescription is filled and some pharmacies may offer cheaper prices.  The website www.goodrx.com contains coupons for medications through different pharmacies. The prices here do not account for what the cost may be with help from insurance (it may be cheaper with your insurance), but the website can give you the price if you did not use any insurance.  - You can print the associated coupon and take it with your prescription to the pharmacy.  - You may also stop by our office during regular business hours and pick up a GoodRx coupon card.  - If you need your prescription sent electronically to a different pharmacy, notify our office through [facility] or by phone at [phone] option 4.     Si Usted Necesita Algo Despus de Su Visita  Tambin puede enviarnos un mensaje a travs de Clinical Cytogeneticist. Por lo general respondemos a los mensajes de MyChart en el transcurso de 1 a 2 das hbiles.  Para renovar recetas, por favor pida a su farmacia que se ponga en contacto con nuestra oficina. Randi lakes de fax es [place] [phone].  Si tiene un asunto urgente cuando la clnica est cerrada y que no puede esperar hasta el siguiente da hbil, puede llamar/localizar a su doctor(a) al nmero que aparece a continuacin.   Por favor, tenga en cuenta que  aunque hacemos todo lo posible para estar disponibles para asuntos urgentes fuera del horario de oficina, no estamos disponibles las 24 horas del da, los 7 [address]  [address] de la [place].   Si tiene un problema urgente y no puede comunicarse con nosotros, puede optar por buscar atencin mdica  en el consultorio de su doctor(a), en una clnica privada, en un centro de atencin urgente o en una sala de emergencias.  Si tiene engineer, drilling, por favor llame inmediatamente al 911 o vaya a la sala de emergencias.  Nmeros de bper  - Dr. Hester: [phone]  - Dra. Jackquline: [phone]  - Dr. Claudene: [phone]  - Dra. Kitts:  [phone]  En caso de inclemencias del [place], por favor llame a nuestra lnea principal al [phone] para una actualizacin sobre el estado de cualquier retraso o cierre.  Consejos para la medicacin en dermatologa: Por favor, guarde las cajas en las que vienen los medicamentos de uso tpico para ayudarle a seguir las instrucciones sobre dnde y cmo usarlos. Las farmacias generalmente imprimen las instrucciones del medicamento slo en las cajas y no directamente en los tubos del [place].   Si su medicamento es muy caro, por favor, pngase en contacto con landry rieger llamando al [phone] y presione la opcin 4 o envenos un mensaje a travs de Clinical Cytogeneticist.   No podemos decirle cul ser su copago por los medicamentos por adelantado ya que esto es diferente dependiendo de la cobertura de su seguro. Sin embargo, es posible que podamos encontrar un medicamento sustituto a audiological scientist un formulario para que el seguro cubra el medicamento que se considera necesario.   Si se requiere una autorizacin previa para que su compaa de seguros cubra su medicamento, por favor permtanos de 1 a 2 das hbiles para completar este proceso.  Los precios de los medicamentos varan con frecuencia dependiendo del environmental consultant de dnde se surte la receta y alguna farmacias pueden ofrecer precios ms baratos.  El sitio web www.goodrx.com tiene cupones para medicamentos de health and safety inspector. Los precios aqu no tienen en cuenta lo que podra costar con la ayuda del seguro (puede ser ms barato con su seguro), pero el sitio web puede darle el precio si no utiliz tourist information centre manager.  - Puede imprimir el cupn correspondiente y llevarlo con su receta a la farmacia.  - Tambin puede pasar por nuestra oficina durante el horario de atencin regular y education officer, museum una tarjeta de cupones de GoodRx.  - Si necesita que su receta se enve electrnicamente a una farmacia diferente, informe a nuestra oficina a travs de  MyChart de [place] o por telfono llamando al [phone] y presione la opcin 4.

## 2024-07-20 NOTE — Progress Notes (Signed)
" °  °  Subjective   Shelly Huang is a 69 y.o. female who presents for the following: Rash. Patient is new patient  Today patient reports: Dx'd w/HS ~30 years ago; she is not currently using any prescription treatment and has been flared for ~8 months. She has lesions in her groin and buttock.   Review of Systems:    No other skin or systemic complaints except as noted in HPI or Assessment and Plan.  The following portions of the chart were reviewed this encounter and updated as appropriate: medications, allergies, medical history  Relevant Medical History:  n/a   Objective  (SKPE) Well appearing patient in no apparent distress; mood and affect are within normal limits. Examination was performed of the: Genital exam female: groin, mons pubis, labia majora, labia minora, vulva, buttocks. Chaperone present in room.   Examination notable for: - Firm, erythematous papulonodules and cysts in the groin and buttock Examination limited by: Undergarments and Patient deferred removal       Assessment & Plan  (SKAP)   Hidradenitis suppurativa, Hurley stage 3, severe mons pubis and buttock, groin  Chronic and persistent condition with duration or expected duration over one year. Condition is symptomatic and bothersome to patient. Patient is flaring and not currently at treatment goal.  - Prior failed tx: oral abx, I&D, hibiclens  - Discussed the chronic, relapsing nature of this skin disease, characterized by recurring inflamed painful nodules with abscess and sinus formation, and scarring - Explained to patient that early, isolated lesions can remit with medical treatment, but once sinus tracts are established treatment options are limited and surgery is the only definitive treatment; however, recurrence rate can be up to 25% even with surgery - Start topical clindamycin  1% lotion or solution - Start doxycycline  100 mg BID x1 month Doxycycline  should be taken with food to prevent nausea. Do not  lay down for 30 minutes after taking. Be cautious with sun exposure and use good sun protection while on this medication. Pregnant women should not take this medication.  - Start secukinumab  - Discussed side effects of medication at length including increased risk of infection, nasopharyngitis, rash, diarrhea, exacerbation of IBD, headache - check CBC, CMP and quant gold - confirmed no history of inflammatory bowel disease - Monitoring: annual Quantiferon - Loading dose: 300mg  weekly on weeks 0, 1, 2, 3, 4 followed by 300mg  monthly - educated on risk of injection site reactions, infections, IBD exacerbation    Was sun protection counseling provided?: Yes   Level of service outlined above   Patient instructions (SKPI)   Procedures, orders, diagnosis for this visit:    There are no diagnoses linked to this encounter.  Return to clinic: No follow-ups on file.  I, Emerick Ege, CMA am acting as scribe for Lauraine JAYSON Kanaris, MD.   Documentation: I have reviewed the above documentation for accuracy and completeness, and I agree with the above.  Lauraine JAYSON Kanaris, MD  "

## 2024-07-23 LAB — HEPATITIS C ANTIBODY: Hep C Virus Ab: NONREACTIVE

## 2024-07-23 LAB — CBC WITH DIFF/PLATELET
Basophils Absolute: 0 x10E3/uL (ref 0.0–0.2)
Basos: 1 %
EOS (ABSOLUTE): 0 x10E3/uL (ref 0.0–0.4)
Eos: 0 %
Hematocrit: 34.9 % (ref 34.0–46.6)
Hemoglobin: 11.2 g/dL (ref 11.1–15.9)
Immature Grans (Abs): 0 x10E3/uL (ref 0.0–0.1)
Immature Granulocytes: 0 %
Lymphocytes Absolute: 0.7 x10E3/uL (ref 0.7–3.1)
Lymphs: 20 %
MCH: 29 pg (ref 26.6–33.0)
MCHC: 32.1 g/dL (ref 31.5–35.7)
MCV: 90 fL (ref 79–97)
Monocytes Absolute: 0.3 x10E3/uL (ref 0.1–0.9)
Monocytes: 9 %
Neutrophils Absolute: 2.6 x10E3/uL (ref 1.4–7.0)
Neutrophils: 70 %
Platelets: 286 x10E3/uL (ref 150–450)
RBC: 3.86 x10E6/uL (ref 3.77–5.28)
RDW: 13.2 % (ref 11.7–15.4)
WBC: 3.7 x10E3/uL (ref 3.4–10.8)

## 2024-07-23 LAB — HEPATITIS B CORE ANTIBODY, TOTAL: Hep B Core Total Ab: NEGATIVE

## 2024-07-23 LAB — QUANTIFERON-TB GOLD PLUS
QuantiFERON Mitogen Value: 5.57 [IU]/mL
QuantiFERON Nil Value: 0.01 [IU]/mL
QuantiFERON TB1 Ag Value: 0.01 [IU]/mL
QuantiFERON TB2 Ag Value: 0.01 [IU]/mL
QuantiFERON-TB Gold Plus: NEGATIVE

## 2024-07-23 LAB — HIV ANTIBODY (ROUTINE TESTING W REFLEX): HIV Screen 4th Generation wRfx: NONREACTIVE

## 2024-07-23 LAB — HEPATITIS B SURFACE ANTIGEN: Hepatitis B Surface Ag: NEGATIVE

## 2024-07-23 LAB — BUN: BUN: 21 mg/dL (ref 8–27)

## 2024-07-23 LAB — CREATININE WITH EST GFR
Creatinine, Ser: 1.6 mg/dL — ABNORMAL HIGH (ref 0.57–1.00)
eGFR: 35 mL/min/1.73 — ABNORMAL LOW

## 2024-07-23 LAB — HEPATITIS B SURFACE ANTIBODY,QUALITATIVE: Hep B Surface Ab, Qual: NONREACTIVE

## 2024-07-23 LAB — AST: AST: 20 IU/L (ref 0–40)

## 2024-07-23 LAB — ALT: ALT: 6 IU/L (ref 0–32)

## 2024-07-24 ENCOUNTER — Other Ambulatory Visit: Payer: Self-pay

## 2024-07-24 ENCOUNTER — Ambulatory Visit: Payer: Self-pay

## 2024-07-24 MED ORDER — CLINDAMYCIN PHOSPHATE 1 % EX LOTN: TOPICAL_LOTION | Freq: Every day | CUTANEOUS | 5 refills | Status: AC

## 2024-07-24 NOTE — Progress Notes (Signed)
Patient informed and voiced good understanding.

## 2024-07-24 NOTE — Progress Notes (Signed)
 Clindamycin  swab not covered by insurance.

## 2024-09-14 ENCOUNTER — Ambulatory Visit
